# Patient Record
Sex: Female | Born: 1940 | Race: White | Hispanic: No | Marital: Married | Smoking: Never smoker
Health system: Southern US, Community
[De-identification: ages and names within clinical notes are randomized; demographics above are authoritative.]

## PROBLEM LIST (undated history)

## (undated) DIAGNOSIS — Z86010 Personal history of colonic polyps: Secondary | ICD-10-CM

## (undated) DIAGNOSIS — H25019 Cortical age-related cataract, unspecified eye: Secondary | ICD-10-CM

## (undated) DIAGNOSIS — M8580 Other specified disorders of bone density and structure, unspecified site: Secondary | ICD-10-CM

## (undated) DIAGNOSIS — M1990 Unspecified osteoarthritis, unspecified site: Secondary | ICD-10-CM

## (undated) HISTORY — PX: EYE SURGERY: SHX253

## (undated) HISTORY — PX: CHOLECYSTECTOMY: SHX55

## (undated) HISTORY — PX: COLONOSCOPY: SHX174

## (undated) HISTORY — PX: APPENDECTOMY: SHX54

## (undated) HISTORY — PX: TONSILLECTOMY: SUR1361

---

## 2017-05-18 ENCOUNTER — Other Ambulatory Visit: Payer: Self-pay | Admitting: Family Medicine

## 2017-05-18 DIAGNOSIS — Z1231 Encounter for screening mammogram for malignant neoplasm of breast: Secondary | ICD-10-CM

## 2017-06-13 ENCOUNTER — Encounter (INDEPENDENT_AMBULATORY_CARE_PROVIDER_SITE_OTHER): Payer: Self-pay

## 2017-06-13 ENCOUNTER — Ambulatory Visit
Admission: RE | Admit: 2017-06-13 | Discharge: 2017-06-13 | Disposition: A | Discharge status: Home / Self Care | Payer: Medicare HMO | Source: Ambulatory Visit | Attending: Family Medicine | Admitting: Family Medicine

## 2017-06-13 DIAGNOSIS — Z1231 Encounter for screening mammogram for malignant neoplasm of breast: Secondary | ICD-10-CM

## 2017-06-20 ENCOUNTER — Other Ambulatory Visit: Payer: Self-pay | Admitting: *Deleted

## 2017-06-20 ENCOUNTER — Inpatient Hospital Stay
Admission: RE | Admit: 2017-06-20 | Discharge: 2017-06-20 | Disposition: A | Discharge status: Home / Self Care | Payer: Self-pay | Source: Ambulatory Visit | Attending: *Deleted | Admitting: *Deleted

## 2017-06-20 DIAGNOSIS — Z9289 Personal history of other medical treatment: Secondary | ICD-10-CM

## 2019-05-26 ENCOUNTER — Other Ambulatory Visit: Payer: Self-pay | Admitting: Family Medicine

## 2019-05-26 DIAGNOSIS — Z1231 Encounter for screening mammogram for malignant neoplasm of breast: Secondary | ICD-10-CM

## 2019-06-17 ENCOUNTER — Other Ambulatory Visit: Payer: Self-pay

## 2019-06-17 ENCOUNTER — Encounter (INDEPENDENT_AMBULATORY_CARE_PROVIDER_SITE_OTHER): Payer: Self-pay

## 2019-06-17 ENCOUNTER — Ambulatory Visit
Admission: RE | Admit: 2019-06-17 | Discharge: 2019-06-17 | Disposition: A | Discharge status: Home / Self Care | Payer: Medicare HMO | Source: Ambulatory Visit | Attending: Family Medicine | Admitting: Family Medicine

## 2019-06-17 DIAGNOSIS — Z1231 Encounter for screening mammogram for malignant neoplasm of breast: Secondary | ICD-10-CM | POA: Insufficient documentation

## 2019-06-19 ENCOUNTER — Other Ambulatory Visit: Payer: Self-pay

## 2019-06-19 ENCOUNTER — Other Ambulatory Visit
Admission: RE | Admit: 2019-06-19 | Discharge: 2019-06-19 | Disposition: A | Discharge status: Home / Self Care | Payer: Medicare HMO | Source: Ambulatory Visit | Attending: Gastroenterology | Admitting: Gastroenterology

## 2019-06-19 DIAGNOSIS — Z20828 Contact with and (suspected) exposure to other viral communicable diseases: Secondary | ICD-10-CM | POA: Insufficient documentation

## 2019-06-19 DIAGNOSIS — Z01812 Encounter for preprocedural laboratory examination: Secondary | ICD-10-CM | POA: Diagnosis present

## 2019-06-19 LAB — SARS CORONAVIRUS 2 (TAT 6-24 HRS): SARS Coronavirus 2: NEGATIVE

## 2019-06-20 ENCOUNTER — Encounter: Payer: Self-pay | Admitting: *Deleted

## 2019-06-22 ENCOUNTER — Encounter: Payer: Self-pay | Admitting: Anesthesiology

## 2019-06-23 ENCOUNTER — Ambulatory Visit
Admission: RE | Admit: 2019-06-23 | Discharge: 2019-06-23 | Disposition: A | Discharge status: Home / Self Care | Payer: Medicare HMO | Attending: Gastroenterology | Admitting: Gastroenterology

## 2019-06-23 ENCOUNTER — Ambulatory Visit: Payer: Medicare HMO | Admitting: Anesthesiology

## 2019-06-23 ENCOUNTER — Encounter: Payer: Self-pay | Admitting: *Deleted

## 2019-06-23 ENCOUNTER — Encounter
Admission: RE | Disposition: A | Discharge status: Home / Self Care | Payer: Self-pay | Source: Home / Self Care | Attending: Gastroenterology

## 2019-06-23 ENCOUNTER — Other Ambulatory Visit: Payer: Self-pay

## 2019-06-23 DIAGNOSIS — M1990 Unspecified osteoarthritis, unspecified site: Secondary | ICD-10-CM | POA: Insufficient documentation

## 2019-06-23 DIAGNOSIS — D125 Benign neoplasm of sigmoid colon: Secondary | ICD-10-CM | POA: Diagnosis not present

## 2019-06-23 DIAGNOSIS — K640 First degree hemorrhoids: Secondary | ICD-10-CM | POA: Diagnosis not present

## 2019-06-23 DIAGNOSIS — Z86010 Personal history of colonic polyps: Secondary | ICD-10-CM | POA: Diagnosis not present

## 2019-06-23 DIAGNOSIS — Z880 Allergy status to penicillin: Secondary | ICD-10-CM | POA: Insufficient documentation

## 2019-06-23 DIAGNOSIS — Z800 Family history of malignant neoplasm of digestive organs: Secondary | ICD-10-CM | POA: Insufficient documentation

## 2019-06-23 DIAGNOSIS — K5730 Diverticulosis of large intestine without perforation or abscess without bleeding: Secondary | ICD-10-CM | POA: Insufficient documentation

## 2019-06-23 HISTORY — DX: Unspecified osteoarthritis, unspecified site: M19.90

## 2019-06-23 HISTORY — DX: Personal history of colonic polyps: Z86.010

## 2019-06-23 HISTORY — DX: Cortical age-related cataract, unspecified eye: H25.019

## 2019-06-23 HISTORY — DX: Other specified disorders of bone density and structure, unspecified site: M85.80

## 2019-06-23 HISTORY — PX: COLONOSCOPY WITH PROPOFOL: SHX5780

## 2019-06-23 SURGERY — COLONOSCOPY WITH PROPOFOL
Anesthesia: General

## 2019-06-23 MED ORDER — GLYCOPYRROLATE 0.2 MG/ML IJ SOLN
INTRAMUSCULAR | Status: AC
  Filled 2019-06-23: qty 1

## 2019-06-23 MED ORDER — PROPOFOL 10 MG/ML IV BOLUS
INTRAVENOUS | Status: DC | PRN
  Administered 2019-06-23: 70 mg via INTRAVENOUS

## 2019-06-23 MED ORDER — PROPOFOL 500 MG/50ML IV EMUL
INTRAVENOUS | Status: AC
  Filled 2019-06-23: qty 50

## 2019-06-23 MED ORDER — LIDOCAINE HCL (PF) 2 % IJ SOLN
INTRAMUSCULAR | Status: AC
  Filled 2019-06-23: qty 10

## 2019-06-23 MED ORDER — EPHEDRINE SULFATE 50 MG/ML IJ SOLN
INTRAMUSCULAR | Status: AC
  Filled 2019-06-23: qty 1

## 2019-06-23 MED ORDER — PROPOFOL 500 MG/50ML IV EMUL
INTRAVENOUS | Status: DC | PRN
  Administered 2019-06-23: 140 ug/kg/min via INTRAVENOUS

## 2019-06-23 MED ORDER — SODIUM CHLORIDE 0.9 % IV SOLN
INTRAVENOUS | Status: DC
  Administered 2019-06-23: 07:00:00 via INTRAVENOUS

## 2019-06-23 MED ORDER — EPHEDRINE SULFATE 50 MG/ML IJ SOLN
INTRAMUSCULAR | Status: DC | PRN
  Administered 2019-06-23: 5 mg via INTRAVENOUS

## 2019-06-23 NOTE — Op Note (Signed)
[facility] Gastroenterology Patient Name: Courtney Flores Procedure Date: 06/23/2019 7:42 AM MRN: [id number] Account #: [id number] Date of Birth: May 21, 1941 Admit Type: Outpatient Age: 78 Room: [facility] ENDO ROOM 1 Gender: Female Note Status: Finalized Procedure:            Colonoscopy Indications:          Family history of colon cancer in a first-degree                        relative before age 38 years, Personal history of                        colonic polyps Providers:            Lollie Sails, MD Referring MD:         Sofie Hartigan (Referring MD) Medicines:            Monitored Anesthesia Care Complications:        No immediate complications. Procedure:            Pre-Anesthesia Assessment:                       - ASA Grade Assessment: II - A patient with mild                        systemic disease.                       After obtaining informed consent, the colonoscope was                        passed under direct vision. Throughout the procedure,                        the patient's blood pressure, pulse, and oxygen                        saturations were monitored continuously. The                        Colonoscope was introduced through the anus and                        advanced to the the cecum, identified by appendiceal                        orifice and ileocecal valve. The colonoscopy was                        performed without difficulty. The patient tolerated the                        procedure well. The quality of the bowel preparation                        was good. Findings:      A 4 mm polyp was found in the distal sigmoid colon. The polyp was       sessile. The polyp was removed with a cold snare. Resection and       retrieval were complete.      Non-bleeding internal  hemorrhoids were found during retroflexion and       during anoscopy. The hemorrhoids were small and Grade I (internal       hemorrhoids that do not prolapse).    A few small-mouthed diverticula were found in the sigmoid colon and       descending colon.      The exam was otherwise without abnormality. Note small internal anal       pillar.      The digital rectal exam was normal. Impression:           - One 4 mm polyp in the distal sigmoid colon, removed                        with a cold snare. Resected and retrieved.                       - Non-bleeding internal hemorrhoids.                       - Diverticulosis in the sigmoid colon and in the                        descending colon.                       - The examination was otherwise normal. Recommendation:       - Discharge patient to home. Procedure Code(s):    --- Professional ---                       [phone], Colonoscopy, flexible; with removal of tumor(s),                        polyp(s), or other lesion(s) by snare technique Diagnosis Code(s):    --- Professional ---                       K63.5, Polyp of colon                       K64.0, First degree hemorrhoids                       Z80.0, Family history of malignant neoplasm of                        digestive organs                       Z86.010, Personal history of colonic polyps                       K57.30, Diverticulosis of large intestine without                        perforation or abscess without bleeding CPT copyright 2019 American [employer]. All rights reserved. The codes documented in this report are preliminary and upon coder review may  be revised to meet current compliance requirements. Lollie Sails, MD 06/23/2019 8:34:52 AM This report has been signed electronically. Number of Addenda: 0 Note Initiated On: 06/23/2019 7:42 AM Scope Withdrawal Time: 0 hours 8 minutes 51 seconds  Total Procedure Duration: 0 hours 23 minutes 48 seconds  [facility]

## 2019-06-23 NOTE — Anesthesia Postprocedure Evaluation (Signed)
Anesthesia Post Note  Patient: Courtney Flores  Procedure(s) Performed: COLONOSCOPY WITH PROPOFOL (N/A )  Patient location during evaluation: Endoscopy Anesthesia Type: General Level of consciousness: awake and alert Pain management: pain level controlled Vital Signs Assessment: post-procedure vital signs reviewed and stable Respiratory status: spontaneous breathing, nonlabored ventilation, respiratory function stable and patient connected to nasal cannula oxygen Cardiovascular status: blood pressure returned to baseline and stable Postop Assessment: no apparent nausea or vomiting Anesthetic complications: no     Last Vitals:  Vitals:   06/23/19 0854 06/23/19 0904  BP: (!) 116/94 123/72  Pulse:    Resp:    Temp:    SpO2:      Last Pain:  Vitals:   06/23/19 0904  TempSrc:   PainSc: 0-No pain                 Raushanah Osmundson S

## 2019-06-23 NOTE — Transfer of Care (Signed)
Immediate Anesthesia Transfer of Care Note  Patient: Courtney Flores  Procedure(s) Performed: COLONOSCOPY WITH PROPOFOL (N/A )  Patient Location: PACU  Anesthesia Type:General  Level of Consciousness: sedated  Airway & Oxygen Therapy: patient spontaneous breathing  Post-op Assessment: Report given to RN and Post -op Vital signs reviewed and stable  Post vital signs: Reviewed and stable  Last Vitals:  Vitals Value Taken Time  BP 96/58 06/23/19 0835  Temp 36.4 C 06/23/19 0835  Pulse 63 06/23/19 0835  Resp 12 06/23/19 0835  SpO2 99 % 06/23/19 0835  Vitals shown include unvalidated device data.  Last Pain:  Vitals:   06/23/19 0835  TempSrc:   PainSc: 0-No pain         Complications: No apparent anesthesia complications

## 2019-06-23 NOTE — H&P (Signed)
Outpatient short stay form Pre-procedure 06/23/2019 7:59 AM Lollie Sails MD  Primary Physician: Thereasa Distance, MD  Reason for visit: Colonoscopy  History of present illness: Patient is a 78 year old female presenting today for colonoscopy in regards to a personal history of colon polyps and family history of colon cancer in in a primary relative, sister.  Patient has had several abdominal instrumentations including a cholecystectomy appendectomy and C-section remote.  Tolerated her prep well.  She takes no aspirin or blood thinning agent.  She denies any problems with diarrhea abdominal pain or rectal bleeding.  She does state that in the past several weeks she has had some rectal irritation with a bowel movement.    Current Facility-Administered Medications:  .  0.9 %  sodium chloride infusion, , Intravenous, Continuous, Lollie Sails, MD, Last Rate: 20 mL/hr at 06/23/19 0710  Medications Prior to Admission  Medication Sig Dispense Refill Last Dose  . calcium-vitamin D (OSCAL WITH D) 500-200 MG-UNIT tablet Take 1 tablet by mouth daily with breakfast.   Past Week at Unknown time  . latanoprost (XALATAN) 0.005 % ophthalmic solution Place 1 drop into both eyes at bedtime.   Past Week at Unknown time  . Multiple Vitamin (MULTIVITAMIN) tablet Take 1 tablet by mouth daily.   Past Week at Unknown time     Allergies  Allergen Reactions  . Penicillins Other (See Comments)    UNKNOWN     Past Medical History:  Diagnosis Date  . Arthritis   . Cataract cortical, senile   . History of colon polyps   . Osteopenia     Review of systems:      Physical Exam    Heart and lungs: Regular rate and rhythm without rub or gallop lungs are bilaterally clear    HEENT: Normocephalic atraumatic eyes are anicteric    Other:    Pertinant exam for procedure: Soft nontender nondistended bowel sounds positive normal active    Planned proceedures: Colonoscopy and indicated procedures.  I have discussed the risks benefits and complications of procedures to include not limited to bleeding, infection, perforation and the risk of sedation and the patient wishes to proceed.    Lollie Sails, MD Gastroenterology 06/23/2019  7:59 AM

## 2019-06-23 NOTE — Anesthesia Preprocedure Evaluation (Signed)
Anesthesia Evaluation  Patient identified by MRN, date of birth, ID band Patient awake    Reviewed: Allergy & Precautions, NPO status , Patient's Chart, lab work & pertinent test results, reviewed documented beta blocker date and time   Airway Mallampati: II  TM Distance: >3 FB     Dental  (+) Chipped   Pulmonary           Cardiovascular      Neuro/Psych    GI/Hepatic   Endo/Other    Renal/GU      Musculoskeletal  (+) Arthritis ,   Abdominal   Peds  Hematology   Anesthesia Other Findings   Reproductive/Obstetrics                             Anesthesia Physical Anesthesia Plan  ASA: II  Anesthesia Plan: General   Post-op Pain Management:    Induction: Intravenous  PONV Risk Score and Plan:   Airway Management Planned:   Additional Equipment:   Intra-op Plan:   Post-operative Plan:   Informed Consent: I have reviewed the patients History and Physical, chart, labs and discussed the procedure including the risks, benefits and alternatives for the proposed anesthesia with the patient or authorized representative who has indicated his/her understanding and acceptance.       Plan Discussed with: CRNA  Anesthesia Plan Comments:         Anesthesia Quick Evaluation

## 2019-06-23 NOTE — Anesthesia Post-op Follow-up Note (Signed)
Anesthesia QCDR form completed.        

## 2019-06-24 ENCOUNTER — Encounter: Payer: Self-pay | Admitting: Gastroenterology

## 2019-06-24 LAB — SURGICAL PATHOLOGY

## 2019-12-19 ENCOUNTER — Other Ambulatory Visit: Payer: Self-pay | Admitting: Orthopedic Surgery

## 2019-12-22 ENCOUNTER — Ambulatory Visit: Payer: Medicare HMO | Admitting: Anesthesiology

## 2019-12-22 ENCOUNTER — Encounter
Admission: RE | Disposition: A | Discharge status: Home / Self Care | Payer: Self-pay | Source: Home / Self Care | Attending: Orthopedic Surgery

## 2019-12-22 ENCOUNTER — Encounter: Payer: Self-pay | Admitting: Orthopedic Surgery

## 2019-12-22 ENCOUNTER — Ambulatory Visit: Payer: Medicare HMO

## 2019-12-22 ENCOUNTER — Other Ambulatory Visit
Admission: RE | Admit: 2019-12-22 | Discharge: 2019-12-22 | Disposition: A | Discharge status: Home / Self Care | Payer: Medicare HMO | Source: Ambulatory Visit | Attending: Orthopedic Surgery | Admitting: Orthopedic Surgery

## 2019-12-22 ENCOUNTER — Other Ambulatory Visit: Payer: Self-pay

## 2019-12-22 ENCOUNTER — Ambulatory Visit
Admission: RE | Admit: 2019-12-22 | Discharge: 2019-12-22 | Disposition: A | Discharge status: Home / Self Care | Payer: Medicare HMO | Attending: Orthopedic Surgery | Admitting: Orthopedic Surgery

## 2019-12-22 DIAGNOSIS — Z419 Encounter for procedure for purposes other than remedying health state, unspecified: Secondary | ICD-10-CM

## 2019-12-22 DIAGNOSIS — Z880 Allergy status to penicillin: Secondary | ICD-10-CM | POA: Insufficient documentation

## 2019-12-22 DIAGNOSIS — M1990 Unspecified osteoarthritis, unspecified site: Secondary | ICD-10-CM | POA: Insufficient documentation

## 2019-12-22 DIAGNOSIS — Z800 Family history of malignant neoplasm of digestive organs: Secondary | ICD-10-CM | POA: Insufficient documentation

## 2019-12-22 DIAGNOSIS — Z79899 Other long term (current) drug therapy: Secondary | ICD-10-CM | POA: Insufficient documentation

## 2019-12-22 DIAGNOSIS — Z825 Family history of asthma and other chronic lower respiratory diseases: Secondary | ICD-10-CM | POA: Insufficient documentation

## 2019-12-22 DIAGNOSIS — M8580 Other specified disorders of bone density and structure, unspecified site: Secondary | ICD-10-CM | POA: Insufficient documentation

## 2019-12-22 DIAGNOSIS — Z8249 Family history of ischemic heart disease and other diseases of the circulatory system: Secondary | ICD-10-CM | POA: Diagnosis not present

## 2019-12-22 DIAGNOSIS — Z811 Family history of alcohol abuse and dependence: Secondary | ICD-10-CM | POA: Insufficient documentation

## 2019-12-22 DIAGNOSIS — Z20822 Contact with and (suspected) exposure to covid-19: Secondary | ICD-10-CM | POA: Diagnosis not present

## 2019-12-22 DIAGNOSIS — W19XXXA Unspecified fall, initial encounter: Secondary | ICD-10-CM | POA: Insufficient documentation

## 2019-12-22 DIAGNOSIS — Z86010 Personal history of colonic polyps: Secondary | ICD-10-CM | POA: Diagnosis not present

## 2019-12-22 DIAGNOSIS — S72011A Unspecified intracapsular fracture of right femur, initial encounter for closed fracture: Secondary | ICD-10-CM | POA: Diagnosis present

## 2019-12-22 DIAGNOSIS — Z9049 Acquired absence of other specified parts of digestive tract: Secondary | ICD-10-CM | POA: Diagnosis not present

## 2019-12-22 DIAGNOSIS — Z833 Family history of diabetes mellitus: Secondary | ICD-10-CM | POA: Insufficient documentation

## 2019-12-22 DIAGNOSIS — Z8042 Family history of malignant neoplasm of prostate: Secondary | ICD-10-CM | POA: Diagnosis not present

## 2019-12-22 HISTORY — PX: HIP PINNING,CANNULATED: SHX1758

## 2019-12-22 LAB — RESPIRATORY PANEL BY RT PCR (FLU A&B, COVID)
Influenza A by PCR: NEGATIVE
Influenza B by PCR: NEGATIVE
SARS Coronavirus 2 by RT PCR: NEGATIVE

## 2019-12-22 SURGERY — FIXATION, FEMUR, NECK, PERCUTANEOUS, USING SCREW
Anesthesia: General | Site: Hip | Laterality: Right

## 2019-12-22 MED ORDER — SODIUM CHLORIDE (PF) 0.9 % IJ SOLN
INTRAMUSCULAR | Status: AC
  Filled 2019-12-22: qty 50

## 2019-12-22 MED ORDER — DEXAMETHASONE SODIUM PHOSPHATE 10 MG/ML IJ SOLN
INTRAMUSCULAR | Status: AC
  Filled 2019-12-22: qty 1

## 2019-12-22 MED ORDER — BUPIVACAINE HCL (PF) 0.5 % IJ SOLN
INTRAMUSCULAR | Status: AC
  Filled 2019-12-22: qty 30

## 2019-12-22 MED ORDER — FAMOTIDINE 20 MG PO TABS
20.0000 mg | ORAL_TABLET | Freq: Once | ORAL | Status: AC
  Administered 2019-12-22: 13:00:00 20 mg via ORAL

## 2019-12-22 MED ORDER — CLINDAMYCIN PHOSPHATE 900 MG/50ML IV SOLN
INTRAVENOUS | Status: AC
  Filled 2019-12-22: qty 50

## 2019-12-22 MED ORDER — FENTANYL CITRATE (PF) 100 MCG/2ML IJ SOLN
INTRAMUSCULAR | Status: AC
  Filled 2019-12-22: qty 2

## 2019-12-22 MED ORDER — ONDANSETRON HCL 4 MG/2ML IJ SOLN
INTRAMUSCULAR | Status: AC
  Filled 2019-12-22: qty 2

## 2019-12-22 MED ORDER — ONDANSETRON HCL 4 MG/2ML IJ SOLN: 4.0000 mg | Freq: Once | INTRAMUSCULAR | Status: DC | PRN

## 2019-12-22 MED ORDER — CLINDAMYCIN PHOSPHATE 900 MG/50ML IV SOLN
900.0000 mg | INTRAVENOUS | Status: AC
  Administered 2019-12-22: 16:00:00 900 mg via INTRAVENOUS

## 2019-12-22 MED ORDER — FENTANYL CITRATE (PF) 100 MCG/2ML IJ SOLN
INTRAMUSCULAR | Status: AC
  Administered 2019-12-22: 25 ug via INTRAVENOUS
  Filled 2019-12-22: qty 2

## 2019-12-22 MED ORDER — FENTANYL CITRATE (PF) 100 MCG/2ML IJ SOLN
INTRAMUSCULAR | Status: DC | PRN
  Administered 2019-12-22: 25 ug via INTRAVENOUS

## 2019-12-22 MED ORDER — MIDAZOLAM HCL 2 MG/2ML IJ SOLN
INTRAMUSCULAR | Status: DC | PRN
  Administered 2019-12-22 (×2): 1 mg via INTRAVENOUS

## 2019-12-22 MED ORDER — GLYCOPYRROLATE 0.2 MG/ML IJ SOLN
INTRAMUSCULAR | Status: AC
  Filled 2019-12-22: qty 1

## 2019-12-22 MED ORDER — FENTANYL CITRATE (PF) 100 MCG/2ML IJ SOLN
25.0000 ug | INTRAMUSCULAR | Status: DC | PRN
  Administered 2019-12-22: 25 ug via INTRAVENOUS

## 2019-12-22 MED ORDER — ACETAMINOPHEN 10 MG/ML IV SOLN
INTRAVENOUS | Status: AC
  Filled 2019-12-22: qty 100

## 2019-12-22 MED ORDER — OXYCODONE HCL 5 MG PO TABS: 2.5000 mg | ORAL_TABLET | ORAL | 0 refills | Status: AC | PRN

## 2019-12-22 MED ORDER — NEOMYCIN-POLYMYXIN B GU 40-200000 IR SOLN
Status: AC
  Filled 2019-12-22: qty 2

## 2019-12-22 MED ORDER — PHENYLEPHRINE HCL (PRESSORS) 10 MG/ML IV SOLN
INTRAVENOUS | Status: AC
  Filled 2019-12-22: qty 1

## 2019-12-22 MED ORDER — SODIUM CHLORIDE 0.9 % IR SOLN
Status: DC | PRN
  Administered 2019-12-22: 16:00:00 500 mL

## 2019-12-22 MED ORDER — ACETAMINOPHEN 500 MG PO TABS: 1000.0000 mg | ORAL_TABLET | Freq: Three times a day (TID) | ORAL | 2 refills | Status: AC

## 2019-12-22 MED ORDER — LIDOCAINE HCL (PF) 1 % IJ SOLN
INTRAMUSCULAR | Status: AC
  Filled 2019-12-22: qty 30

## 2019-12-22 MED ORDER — ONDANSETRON HCL 4 MG/2ML IJ SOLN
INTRAMUSCULAR | Status: DC | PRN
  Administered 2019-12-22: 4 mg via INTRAVENOUS

## 2019-12-22 MED ORDER — OXYCODONE HCL 5 MG/5ML PO SOLN: 5.0000 mg | Freq: Once | ORAL | Status: AC | PRN

## 2019-12-22 MED ORDER — MIDAZOLAM HCL 2 MG/2ML IJ SOLN
INTRAMUSCULAR | Status: AC
  Filled 2019-12-22: qty 2

## 2019-12-22 MED ORDER — BUPIVACAINE LIPOSOME 1.3 % IJ SUSP
INTRAMUSCULAR | Status: AC
  Filled 2019-12-22: qty 20

## 2019-12-22 MED ORDER — PROPOFOL 10 MG/ML IV BOLUS
INTRAVENOUS | Status: DC | PRN
  Administered 2019-12-22: 110 mg via INTRAVENOUS

## 2019-12-22 MED ORDER — VANCOMYCIN HCL 1000 MG IV SOLR
INTRAVENOUS | Status: AC
  Filled 2019-12-22: qty 1000

## 2019-12-22 MED ORDER — LACTATED RINGERS IV SOLN: INTRAVENOUS | Status: DC

## 2019-12-22 MED ORDER — ASPIRIN EC 325 MG PO TBEC: 325.0000 mg | DELAYED_RELEASE_TABLET | Freq: Every day | ORAL | 0 refills | Status: AC

## 2019-12-22 MED ORDER — GLYCOPYRROLATE 0.2 MG/ML IJ SOLN
INTRAMUSCULAR | Status: DC | PRN
  Administered 2019-12-22: .2 mg via INTRAVENOUS

## 2019-12-22 MED ORDER — SODIUM CHLORIDE 0.9 % IV SOLN
INTRAVENOUS | Status: DC | PRN
  Administered 2019-12-22: 16:00:00 20 ug/min via INTRAVENOUS

## 2019-12-22 MED ORDER — OXYCODONE HCL 5 MG PO TABS: 5.0000 mg | ORAL_TABLET | Freq: Once | ORAL | Status: AC | PRN

## 2019-12-22 MED ORDER — LIDOCAINE HCL (CARDIAC) PF 100 MG/5ML IV SOSY
PREFILLED_SYRINGE | INTRAVENOUS | Status: DC | PRN
  Administered 2019-12-22: 80 mg via INTRAVENOUS

## 2019-12-22 MED ORDER — PHENYLEPHRINE HCL (PRESSORS) 10 MG/ML IV SOLN
INTRAVENOUS | Status: DC | PRN
  Administered 2019-12-22 (×4): 100 ug via INTRAVENOUS

## 2019-12-22 MED ORDER — EPHEDRINE 5 MG/ML INJ
INTRAVENOUS | Status: AC
  Filled 2019-12-22: qty 10

## 2019-12-22 MED ORDER — OXYCODONE HCL 5 MG PO TABS
ORAL_TABLET | ORAL | Status: AC
  Administered 2019-12-22: 5 mg via ORAL
  Filled 2019-12-22: qty 1

## 2019-12-22 MED ORDER — ACETAMINOPHEN 10 MG/ML IV SOLN
INTRAVENOUS | Status: DC | PRN
  Administered 2019-12-22: 1000 mg via INTRAVENOUS

## 2019-12-22 MED ORDER — BUPIVACAINE LIPOSOME 1.3 % IJ SUSP
INTRAMUSCULAR | Status: DC | PRN
  Administered 2019-12-22: 50 mL

## 2019-12-22 MED ORDER — ONDANSETRON 4 MG PO TBDP: 4.0000 mg | ORAL_TABLET | Freq: Three times a day (TID) | ORAL | 0 refills | Status: AC | PRN

## 2019-12-22 MED ORDER — FAMOTIDINE 20 MG PO TABS
ORAL_TABLET | ORAL | Status: AC
  Filled 2019-12-22: qty 1

## 2019-12-22 MED ORDER — DEXAMETHASONE SODIUM PHOSPHATE 10 MG/ML IJ SOLN
INTRAMUSCULAR | Status: DC | PRN
  Administered 2019-12-22: 10 mg via INTRAVENOUS

## 2019-12-22 SURGICAL SUPPLY — 49 items
"PENCIL ELECTRO HAND CTR " (MISCELLANEOUS) ×1 IMPLANT
BIT DRILL 4.9 CANNULATED (BIT) ×1
BIT DRILL CANN QC 4.9 LRG (BIT) IMPLANT
BLADE SURG 15 STRL LF DISP TIS (BLADE) ×1 IMPLANT
BLADE SURG 15 STRL SS (BLADE) ×2
CANISTER SUCT 1200ML W/VALVE (MISCELLANEOUS) ×3 IMPLANT
CHLORAPREP W/TINT 26 (MISCELLANEOUS) ×3 IMPLANT
COVER WAND RF STERILE (DRAPES) ×3 IMPLANT
DERMABOND ADVANCED (GAUZE/BANDAGES/DRESSINGS) ×2
DERMABOND ADVANCED .7 DNX12 (GAUZE/BANDAGES/DRESSINGS) IMPLANT
DRAPE 3/4 80X56 (DRAPES) ×3 IMPLANT
DRAPE SURG 17X11 SM STRL (DRAPES) ×6 IMPLANT
DRAPE U-SHAPE 47X51 STRL (DRAPES) ×6 IMPLANT
DRILL BIT CANNULATED 4.9 (BIT) ×2
DRSG OPSITE POSTOP 3X4 (GAUZE/BANDAGES/DRESSINGS) ×3 IMPLANT
ELECT REM PT RETURN 9FT ADLT (ELECTROSURGICAL) ×3
ELECTRODE REM PT RTRN 9FT ADLT (ELECTROSURGICAL) ×1 IMPLANT
GAUZE XEROFORM 1X8 LF (GAUZE/BANDAGES/DRESSINGS) ×1 IMPLANT
GLOVE BIOGEL PI IND STRL 8 (GLOVE) ×1 IMPLANT
GLOVE BIOGEL PI INDICATOR 8 (GLOVE) ×2
GLOVE SURG SYN 7.5  E (GLOVE) ×4
GLOVE SURG SYN 7.5 E (GLOVE) ×2 IMPLANT
GLOVE SURG SYN 7.5 PF PI (GLOVE) ×2 IMPLANT
GOWN STRL REUS W/ TWL LRG LVL3 (GOWN DISPOSABLE) ×1 IMPLANT
GOWN STRL REUS W/ TWL XL LVL3 (GOWN DISPOSABLE) ×1 IMPLANT
GOWN STRL REUS W/TWL LRG LVL3 (GOWN DISPOSABLE) ×2
GOWN STRL REUS W/TWL XL LVL3 (GOWN DISPOSABLE) ×2
GUIDEWIRE ASNIS 3.2 NONCAL (WIRE) ×8 IMPLANT
KIT TURNOVER CYSTO (KITS) ×3 IMPLANT
MAT ABSORB  FLUID 56X50 GRAY (MISCELLANEOUS) ×4
MAT ABSORB FLUID 56X50 GRAY (MISCELLANEOUS) ×2 IMPLANT
NDL FILTER BLUNT 18X1 1/2 (NEEDLE) ×1 IMPLANT
NEEDLE FILTER BLUNT 18X 1/2SAF (NEEDLE) ×2
NEEDLE FILTER BLUNT 18X1 1/2 (NEEDLE) ×1 IMPLANT
NEEDLE HYPO 22GX1.5 SAFETY (NEEDLE) ×3 IMPLANT
NS IRRIG 500ML POUR BTL (IV SOLUTION) ×3 IMPLANT
PACK HIP COMPR (MISCELLANEOUS) ×3 IMPLANT
PENCIL ELECTRO HAND CTR (MISCELLANEOUS) ×3 IMPLANT
SCREW ASNIS 75MM (Screw) ×4 IMPLANT
SCREW CANN 6.5X80 NONSTRL (Screw) ×4 IMPLANT
STAPLER SKIN PROX 35W (STAPLE) ×1 IMPLANT
SUT MNCRL AB 4-0 PS2 18 (SUTURE) ×2 IMPLANT
SUT VIC AB 0 CT1 36 (SUTURE) ×3 IMPLANT
SUT VIC AB 2-0 CT1 27 (SUTURE) ×2
SUT VIC AB 2-0 CT1 TAPERPNT 27 (SUTURE) ×1 IMPLANT
SYR 30ML LL (SYRINGE) ×3 IMPLANT
SYR 5ML LL (SYRINGE) ×3 IMPLANT
TAPE CLOTH 3X10 WHT NS LF (GAUZE/BANDAGES/DRESSINGS) ×3 IMPLANT
WASHER SCREW MATTA SS 13.0X1.5 (Washer) ×6 IMPLANT

## 2019-12-22 NOTE — Anesthesia Postprocedure Evaluation (Signed)
Anesthesia Post Note  Patient: Courtney Flores  Procedure(s) Performed: CANNULATED HIP PINNING (Right Hip)  Patient location during evaluation: PACU Anesthesia Type: General Level of consciousness: awake and alert and oriented Pain management: pain level controlled Vital Signs Assessment: post-procedure vital signs reviewed and stable Respiratory status: spontaneous breathing Cardiovascular status: blood pressure returned to baseline Anesthetic complications: no     Last Vitals:  Vitals:   12/22/19 1732 12/22/19 1749  BP: 124/65 (!) 116/59  Pulse: 74 72  Resp: 14 14  Temp:    SpO2: 100% 98%    Last Pain:  Vitals:   12/22/19 1749  TempSrc:   PainSc: Asleep                 Julia Alkhatib

## 2019-12-22 NOTE — Discharge Instructions (Signed)
Discharge Instructions - Hip Fracture Surgery   Post-Op Instructions   1. Bracing/crutches/walker - Use crutches or walker as needed until able to wean off with physical therapist.   2. Ice: You should ice the area of surgery ~20-30 minutes on with a similar time without ice. Do this for the first week at least to help with pain and swelling.    3. Driving:  Plan on not driving for at least two weeks. Please note that you are advised NOT to drive while taking narcotic pain medications as you may be impaired and unsafe to drive.   4. Activity: Ankle pumps several times an hour while awake to prevent blood clots. Weight bearing: as tolerated. Use crutches or walker as needed until pain allows you to ambulate without a limp. Bending and straightening the knee and hip is unlimited. Avoid standing more than 5-10 minutes (consecutively) for the first week.  Avoid long distance travel for 2 weeks.  5. Medications:  - You have been provided a prescription for narcotic pain medicine. After surgery, take 1-2 narcotic tablets every 4 hours if needed for severe pain.  - You may take up to 3000mg /day of tylenol (acetaminophen). You can take 1000mg  3x/day. Please check your narcotic. If you have acetaminophen in your narcotic (each tablet will be 325mg ), be careful not to exceed a total of 3000mg /day of acetaminophen.  - A prescription for anti-nausea medication will be provided in case the narcotic medicine or anesthesia causes nausea - take 1 tablet every 6 hours only if nauseated.  - Take enteric coated aspirin 325 mg once daily for 4 weeks to prevent blood clots.    6. Bandages: May remove after 5 days. If there is no drainage, you may shower. There is a clear/purplish adhesive/glue type material overlying the incision. This will fall off on its own.   7. Physical Therapy: Home health physical therapy to start over the next week. The therapist will provide home exercises. No need for exercises until 1st  physical therapy session.     8. Post-Op Appointments: Your first post-op appointment will be with Dr. Posey Pronto in approximately 2 weeks time.     If you find that they have not been scheduled please call the Orthopaedic Appointment front desk at [phone].

## 2019-12-22 NOTE — Anesthesia Preprocedure Evaluation (Signed)
Anesthesia Evaluation  Patient identified by MRN, date of birth, ID band Patient awake    Reviewed: Allergy & Precautions, NPO status , Patient's Chart, lab work & pertinent test results  History of Anesthesia Complications Negative for: history of anesthetic complications  Airway Mallampati: II  TM Distance: >3 FB Neck ROM: Full    Dental  (+) Implants   Pulmonary neg pulmonary ROS, neg sleep apnea, neg COPD,    breath sounds clear to auscultation- rhonchi (-) wheezing      Cardiovascular (-) hypertension(-) CAD, (-) Past MI, (-) Cardiac Stents and (-) CABG  Rhythm:Regular Rate:Normal - Systolic murmurs and - Diastolic murmurs    Neuro/Psych neg Seizures negative neurological ROS  negative psych ROS   GI/Hepatic negative GI ROS, Neg liver ROS,   Endo/Other  negative endocrine ROSneg diabetes  Renal/GU negative Renal ROS     Musculoskeletal  (+) Arthritis ,   Abdominal (+) - obese,   Peds  Hematology negative hematology ROS (+)   Anesthesia Other Findings Past Medical History: No date: Arthritis 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, senile No date: History of colon polyps No date: Osteopenia   Reproductive/Obstetrics                             Anesthesia Physical Anesthesia Plan  ASA: II  Anesthesia Plan: General   Post-op Pain Management:    Induction: Intravenous  PONV Risk Score and Plan: 2 and Ondansetron, Dexamethasone and Treatment may vary due to age or medical condition  Airway Management Planned: LMA  Additional Equipment:   Intra-op Plan:   Post-operative Plan:   Informed Consent: I have reviewed the patients History and Physical, chart, labs and discussed the procedure including the risks, benefits and alternatives for the proposed anesthesia with the patient or authorized representative who has indicated his/her understanding and acceptance.     Dental advisory  given  Plan Discussed with: CRNA and Anesthesiologist  Anesthesia Plan Comments:         Anesthesia Quick Evaluation

## 2019-12-22 NOTE — Op Note (Signed)
DATE OF SURGERY: 12/22/2019  PREOPERATIVE DIAGNOSIS: Right valgus impacted femoral neck fracture  POSTOPERATIVE DIAGNOSIS: Right valgus impacted femoral neck fracture  PROCEDURE: Percutaneous pinning of Right femoral neck fracture  SURGEON: Cato Mulligan, MD  ASSISTANTS: Benson Norway, PA-S  ANESTHESIA: Gen  EBL: 50 cc  COMPONENTS:  Stryker 6.51mm cannulated screws x 3 with washers (64mm, 93mm, 24mm)   INDICATIONS: Courtney Flores is a 79 y.o. female who sustained a valgus impacted femoral neck fracture after a fall ~1 week ago. She had continued to bear weight until she obtained radiographs at her PCP's office which showed R valgus impacted femoral neck fracture. Risks and benefits of percutaneous pinning were explained to the patient and family. Risks include but are not limited to bleeding, infection, injury to tissues, nerves, vessels, DVT/PE, malunion/nonunion, hardware failure, avascular necrosis, and risks of anesthesia. The patient and family understand these risks, have completed an informed consent, and wish to proceed.   PROCEDURE:  The patient was brought into the operating room. After administering general anesthesia, the patient was placed in the supine position on the Hana table. The uninjured leg was extended while the injured lower extremity was placed in a neutral position with care taken to not displace the fracture during positioning. The lateral aspects of the operative hip and thigh were prepped with ChloraPrep solution before being draped sterilely. IV antibiotics were administered. A timeout was performed to verify the appropriate surgical site, patient, and procedure.   The greater trochanter was identified and an approximately 6 cm incision was made over the lateral aspect of the proximal femur. The incision was carried down through the subcutaneous tissues to expose the IT band. This was split at the proximal portion of the incision and the vastus lateralis was split  in line with its fibers. The lateral aspect of the femur was cleared of soft tissue. Under fluoroscopic guidance, a guidewire was placed along the inferior aspect of the femoral neck into the head while ensuring the start point on the lateral cortex was not below the level of the lesser trochanter. A parallel guide was used to place two additional guidepins (superior posterior and superior anterior). Position of all pins was verified fluoroscopically in both the AP and lateral views. A measuring device was used the measure appropriate screw length. The guidepins were drilled with a 3.26mm drill. Appropriately sized screws were advanced starting with the inferior screw, then superior posterior, then superior anterior. Screws were sequentially tightened. Hardware position and bony alignment was confirmed fluoroscopically with AP and lateral views.   The wounds were irrigated thoroughly with sterile saline solution. The IT band was closed with 0-Vicryl. Deep fat sutures were also placed with 0-Vicryl. The subcutaneous tissues were closed using 2-0 Vicryl interrupted sutures. The skin was closed using 4-0 Monocryl and Dermabond. Sterile occlusive dressing was applied. The patient was then transferred to the recovery room in satisfactory condition after tolerating the procedure well.  POSTOPERATIVE PLAN: The patient will be WBAT on the operative extremity. ASA 325mg  daily x 4 weeks to start on POD#1. Home health PT. F/U in ~2 weeks.

## 2019-12-22 NOTE — Transfer of Care (Signed)
Immediate Anesthesia Transfer of Care Note  Patient: Courtney Flores  Procedure(s) Performed: CANNULATED HIP PINNING (Right Hip)  Patient Location: PACU  Anesthesia Type:General  Level of Consciousness: drowsy  Airway & Oxygen Therapy: Patient Spontanous Breathing and Patient connected to face mask oxygen  Post-op Assessment: Report given to RN and Post -op Vital signs reviewed and stable  Post vital signs: Reviewed and stable  Last Vitals:  Vitals Value Taken Time  BP 124/65 12/22/19 1732  Temp    Pulse 74 12/22/19 1736  Resp 19 12/22/19 1736  SpO2 100 % 12/22/19 1736  Vitals shown include unvalidated device data.  Last Pain:  Vitals:   12/22/19 1149  TempSrc: Tympanic         Complications: No apparent anesthesia complications

## 2019-12-22 NOTE — H&P (Signed)
Paper H&P to be scanned into permanent record. H&P reviewed. No significant changes noted.  

## 2019-12-22 NOTE — Anesthesia Procedure Notes (Signed)
Procedure Name: LMA Insertion Performed by: Kelton Pillar, CRNA Pre-anesthesia Checklist: Patient identified, Emergency Drugs available, Suction available and Patient being monitored Patient Re-evaluated:Patient Re-evaluated prior to induction Oxygen Delivery Method: Circle system utilized Preoxygenation: Pre-oxygenation with 100% oxygen Induction Type: IV induction Ventilation: Mask ventilation without difficulty LMA: LMA inserted LMA Size: 3.0 Number of attempts: 1 Tube secured with: Tape Dental Injury: Teeth and Oropharynx as per pre-operative assessment

## 2020-08-18 ENCOUNTER — Other Ambulatory Visit (HOSPITAL_COMMUNITY): Payer: Self-pay | Admitting: Family Medicine

## 2020-08-18 ENCOUNTER — Other Ambulatory Visit: Payer: Self-pay | Admitting: Family Medicine

## 2020-08-18 DIAGNOSIS — J9859 Other diseases of mediastinum, not elsewhere classified: Secondary | ICD-10-CM

## 2020-08-26 ENCOUNTER — Other Ambulatory Visit: Payer: Self-pay

## 2020-08-26 ENCOUNTER — Ambulatory Visit
Admission: RE | Admit: 2020-08-26 | Discharge: 2020-08-26 | Disposition: A | Discharge status: Home / Self Care | Payer: Medicare HMO | Source: Ambulatory Visit | Attending: Family Medicine | Admitting: Family Medicine

## 2020-08-26 ENCOUNTER — Other Ambulatory Visit
Admission: RE | Admit: 2020-08-26 | Discharge: 2020-08-26 | Disposition: A | Discharge status: Home / Self Care | Payer: Medicare HMO | Source: Home / Self Care | Attending: Family Medicine | Admitting: Family Medicine

## 2020-08-26 DIAGNOSIS — J9859 Other diseases of mediastinum, not elsewhere classified: Secondary | ICD-10-CM | POA: Diagnosis not present

## 2020-08-26 DIAGNOSIS — Z01812 Encounter for preprocedural laboratory examination: Secondary | ICD-10-CM | POA: Insufficient documentation

## 2020-08-26 LAB — CREATININE, SERUM
Creatinine, Ser: 0.74 mg/dL (ref 0.44–1.00)
GFR, Estimated: >60 mL/min (ref >60)

## 2020-08-26 MED ORDER — IOHEXOL 300 MG/ML  SOLN
75.0000 mL | Freq: Once | INTRAMUSCULAR | Status: AC | PRN
  Administered 2020-08-26: 75 mL via INTRAVENOUS

## 2020-12-24 ENCOUNTER — Encounter: Payer: Self-pay | Admitting: *Deleted

## 2020-12-27 ENCOUNTER — Encounter: Payer: Self-pay | Admitting: Anesthesiology

## 2020-12-27 ENCOUNTER — Ambulatory Visit
Admission: RE | Admit: 2020-12-27 | Discharge: 2020-12-27 | Disposition: A | Discharge status: Home / Self Care | Payer: Medicare HMO | Source: Ambulatory Visit | Attending: Gastroenterology | Admitting: Gastroenterology

## 2020-12-27 ENCOUNTER — Encounter
Admission: RE | Disposition: A | Discharge status: Home / Self Care | Payer: Self-pay | Source: Ambulatory Visit | Attending: Gastroenterology

## 2020-12-27 ENCOUNTER — Ambulatory Visit: Payer: Medicare HMO | Admitting: Anesthesiology

## 2020-12-27 DIAGNOSIS — Z880 Allergy status to penicillin: Secondary | ICD-10-CM | POA: Insufficient documentation

## 2020-12-27 DIAGNOSIS — K317 Polyp of stomach and duodenum: Secondary | ICD-10-CM | POA: Insufficient documentation

## 2020-12-27 DIAGNOSIS — R053 Chronic cough: Secondary | ICD-10-CM | POA: Insufficient documentation

## 2020-12-27 DIAGNOSIS — Z885 Allergy status to narcotic agent status: Secondary | ICD-10-CM | POA: Insufficient documentation

## 2020-12-27 DIAGNOSIS — Z7951 Long term (current) use of inhaled steroids: Secondary | ICD-10-CM | POA: Diagnosis not present

## 2020-12-27 DIAGNOSIS — K219 Gastro-esophageal reflux disease without esophagitis: Secondary | ICD-10-CM | POA: Insufficient documentation

## 2020-12-27 DIAGNOSIS — Z79899 Other long term (current) drug therapy: Secondary | ICD-10-CM | POA: Insufficient documentation

## 2020-12-27 DIAGNOSIS — F419 Anxiety disorder, unspecified: Secondary | ICD-10-CM | POA: Insufficient documentation

## 2020-12-27 DIAGNOSIS — R059 Cough, unspecified: Secondary | ICD-10-CM | POA: Diagnosis not present

## 2020-12-27 DIAGNOSIS — K449 Diaphragmatic hernia without obstruction or gangrene: Secondary | ICD-10-CM | POA: Diagnosis not present

## 2020-12-27 HISTORY — PX: ESOPHAGOGASTRODUODENOSCOPY (EGD) WITH PROPOFOL: SHX5813

## 2020-12-27 LAB — KOH PREP

## 2020-12-27 SURGERY — ESOPHAGOGASTRODUODENOSCOPY (EGD) WITH PROPOFOL
Anesthesia: General

## 2020-12-27 MED ORDER — PROPOFOL 500 MG/50ML IV EMUL
INTRAVENOUS | Status: DC | PRN
  Administered 2020-12-27: 120 ug/kg/min via INTRAVENOUS

## 2020-12-27 MED ORDER — PROPOFOL 500 MG/50ML IV EMUL
INTRAVENOUS | Status: AC
  Filled 2020-12-27: qty 50

## 2020-12-27 MED ORDER — LIDOCAINE HCL (PF) 2 % IJ SOLN
INTRAMUSCULAR | Status: AC
  Filled 2020-12-27: qty 5

## 2020-12-27 MED ORDER — LIDOCAINE HCL (CARDIAC) PF 100 MG/5ML IV SOSY
PREFILLED_SYRINGE | INTRAVENOUS | Status: DC | PRN
  Administered 2020-12-27: 50 mg via INTRAVENOUS

## 2020-12-27 MED ORDER — SODIUM CHLORIDE 0.9 % IV SOLN
INTRAVENOUS | Status: DC
  Administered 2020-12-27: 1000 mL via INTRAVENOUS

## 2020-12-27 NOTE — Transfer of Care (Signed)
Immediate Anesthesia Transfer of Care Note  Patient: EMAAN GARY  Procedure(s) Performed: ESOPHAGOGASTRODUODENOSCOPY (EGD) WITH PROPOFOL (N/A )  Patient Location: PACU  Anesthesia Type:General  Level of Consciousness: awake and sedated  Airway & Oxygen Therapy: Patient Spontanous Breathing and Patient connected to nasal cannula oxygen  Post-op Assessment: Report given to RN  Post vital signs: Reviewed and stable  Last Vitals:  Vitals Value Taken Time  BP    Temp    Pulse    Resp    SpO2      Last Pain:  Vitals:   12/27/20 1028  TempSrc: Temporal  PainSc: 0-No pain         Complications: No complications documented.

## 2020-12-27 NOTE — H&P (Signed)
Outpatient short stay form Pre-procedure 12/27/2020 11:19 AM Raylene Miyamoto MD, MPH  Primary Physician: Dr. Ellison Hughs  Reason for visit:  Chronic cough/Globus sensation  History of present illness:   80 y/o lady with history of anxiety and cough which over the past 1-2 years has been more persistent. Responded both to PPI and advair. Here for EGD for further evaluation. Feels globus sensation in back of throat. No blood thinners. No abdominal surgery or neck surgery.    Current Facility-Administered Medications:  .  0.9 %  sodium chloride infusion, , Intravenous, Continuous, Givanni Staron, Hilton Cork, MD, Last Rate: 20 mL/hr at 12/27/20 1043, 1,000 mL at 12/27/20 1043  Medications Prior to Admission  Medication Sig Dispense Refill Last Dose  . calcium-vitamin D (OSCAL WITH D) 500-200 MG-UNIT tablet Take 1 tablet by mouth daily with breakfast.   12/26/2020 at Unknown time  . clobetasol ointment (TEMOVATE) 1.61 % Apply 1 application topically 2 (two) times daily.   12/26/2020 at Unknown time  . clonazePAM (KLONOPIN) 0.5 MG tablet Take 0.5 mg by mouth at bedtime as needed (sleep).   Past Week at Unknown time  . FLUoxetine (PROZAC) 20 MG tablet Take 20 mg by mouth daily.   Past Week at Unknown time  . fluticasone-salmeterol (ADVAIR HFA) 230-21 MCG/ACT inhaler Inhale 2 puffs into the lungs 2 (two) times daily.   12/26/2020 at Unknown time  . latanoprost (XALATAN) 0.005 % ophthalmic solution Place 1 drop into both eyes at bedtime.   12/26/2020 at Unknown time  . Magnesium 100 MG TABS Take 100 mg by mouth daily.   12/26/2020 at Unknown time  . Multiple Vitamin (MULTIVITAMIN) tablet Take 1 tablet by mouth daily.   12/26/2020 at Unknown time  . omeprazole (PRILOSEC) 20 MG capsule Take 20 mg by mouth 2 (two) times daily before a meal.   12/26/2020 at Unknown time  . naproxen (NAPROSYN) 500 MG tablet Take 500 mg by mouth 2 (two) times daily with a meal.   prn  . ondansetron (ZOFRAN ODT) 4 MG disintegrating  tablet Take 1 tablet (4 mg total) by mouth every 8 (eight) hours as needed for nausea or vomiting. 20 tablet 0 prn  . traMADol (ULTRAM) 50 MG tablet Take 50 mg by mouth every 6 (six) hours as needed for severe pain.   prn     Allergies  Allergen Reactions  . Oxycodone Other (See Comments)  . Penicillins Other (See Comments)    Did it involve swelling of the face/tongue/throat, SOB, or low BP? No Did it involve sudden or severe rash/hives, skin peeling, or any reaction on the inside of your mouth or nose? No Did you need to seek medical attention at a hospital or doctor's office? No When did it last happen?childhood If all above answers are "NO", may proceed with cephalosporin use.   . Tramadol Other (See Comments)     Past Medical History:  Diagnosis Date  . Arthritis   . Cataract cortical, senile   . History of colon polyps   . Osteopenia     Review of systems:  Otherwise negative.    Physical Exam  Gen: Alert, oriented. Appears stated age.  HEENT: PERRLA. Lungs: No respiratory distress CV: RRR Abd: soft, benign, no masses Ext: No edema    Planned procedures: Proceed with colonoscopy. The patient understands the nature of the planned procedure, indications, risks, alternatives and potential complications including but not limited to bleeding, infection, perforation, damage to internal organs and possible oversedation/side effects  from anesthesia. The patient agrees and gives consent to proceed.  Please refer to procedure notes for findings, recommendations and patient disposition/instructions.     Raylene Miyamoto MD, MPH Gastroenterology 12/27/2020  11:19 AM

## 2020-12-27 NOTE — Anesthesia Procedure Notes (Signed)
Performed by: Cook-Martin, Chelcea Zahn Pre-anesthesia Checklist: Patient identified, Emergency Drugs available, Suction available, Patient being monitored and Timeout performed Patient Re-evaluated:Patient Re-evaluated prior to induction Oxygen Delivery Method: Nasal cannula Preoxygenation: Pre-oxygenation with 100% oxygen Induction Type: IV induction Airway Equipment and Method: Bite block Placement Confirmation: positive ETCO2 and CO2 detector       

## 2020-12-27 NOTE — Interval H&P Note (Signed)
History and Physical Interval Note:  12/27/2020 11:22 AM  Courtney Flores  has presented today for surgery, with the diagnosis of GERD CHRONIC COUGH.  The various methods of treatment have been discussed with the patient and family. After consideration of risks, benefits and other options for treatment, the patient has consented to  Procedure(s): ESOPHAGOGASTRODUODENOSCOPY (EGD) WITH PROPOFOL (N/A) as a surgical intervention.  The patient's history has been reviewed, patient examined, no change in status, stable for surgery.  I have reviewed the patient's chart and labs.  Questions were answered to the patient's satisfaction.     Lesly Rubenstein  Ok to proceed with EGD

## 2020-12-27 NOTE — Op Note (Signed)
[facility] Patient Name: Courtney Flores Procedure Date: 12/27/2020 11:21 AM MRN: [id number] Account #: [id number] Date of Birth: 23-Jun-1941 Admit Type: Outpatient Age: 80 Room: [facility] ENDO ROOM 3 Gender: Female Note Status: Finalized Procedure:             Upper GI endoscopy Indications:           Gastro-esophageal reflux disease, Chronic cough Providers:             Andrey Farmer MD, MD Referring MD:          Sofie Hartigan (Referring MD) Medicines:             Monitored Anesthesia Care Complications:         No immediate complications. Estimated blood loss:                         Minimal. Procedure:             Pre-Anesthesia Assessment:                        - Prior to the procedure, a History and Physical was                         performed, and patient medications and allergies were                         reviewed. The patient is competent. The risks and                         benefits of the procedure and the sedation options and                         risks were discussed with the patient. All questions                         were answered and informed consent was obtained.                         Patient identification and proposed procedure were                         verified by the physician, the nurse, the anesthetist                         and the technician in the endoscopy suite. Mental                         Status Examination: alert and oriented. Airway                         Examination: normal oropharyngeal airway and neck                         mobility. Respiratory Examination: clear to                         auscultation. CV Examination: normal. Prophylactic  Antibiotics: The patient does not require prophylactic                         antibiotics. Prior Anticoagulants: The patient has                         taken no previous anticoagulant or antiplatelet                          agents. ASA Grade Assessment: II - A patient with mild                         systemic disease. After reviewing the risks and                         benefits, the patient was deemed in satisfactory                         condition to undergo the procedure. The anesthesia                         plan was to use monitored anesthesia care (MAC).                         Immediately prior to administration of medications,                         the patient was re-assessed for adequacy to receive                         sedatives. The heart rate, respiratory rate, oxygen                         saturations, blood pressure, adequacy of pulmonary                         ventilation, and response to care were monitored                         throughout the procedure. The physical status of the                         patient was re-assessed after the procedure.                        After obtaining informed consent, the endoscope was                         passed under direct vision. Throughout the procedure,                         the patient's blood pressure, pulse, and oxygen                         saturations were monitored continuously. The Endoscope                         was introduced through the mouth, and advanced to the  second part of duodenum. The upper GI endoscopy was                         accomplished without difficulty. The patient tolerated                         the procedure well. Findings:      White nummular lesions were noted in the entire esophagus. Brushings for       KOH prep were obtained in the entire esophagus. Estimated blood loss:       none.      A small hiatal hernia was present.      The exam of the esophagus was otherwise normal.      Biopsies were taken with a cold forceps in the entire esophagus for       histology. Estimated blood loss was minimal.      Multiple small sessile polyps with no stigmata of recent bleeding were        found in the gastric body. One polyp was sampled with a jumbo cold       forceps. Resection and retrieval were complete. Estimated blood loss was       minimal.      Normal mucosa was found in the stomach. Biopsies were taken with a cold       forceps for Helicobacter pylori testing. Estimated blood loss was       minimal.      The examined duodenum was normal. Impression:            - White nummular lesions in esophageal mucosa.                         Brushings performed.                        - Small hiatal hernia.                        - Multiple gastric polyps. Resected and retrieved.                        - Normal mucosa was found in the stomach. Biopsied.                        - Normal examined duodenum.                        - Biopsies were taken with a cold forceps for                         histology in the entire esophagus. Recommendation:        - Discharge patient to home.                        - Resume previous diet.                        - Continue present medications.                        - Await pathology results.                        -  Return to referring physician as previously                         scheduled. Procedure Code(s):     --- Professional ---                        [phone], Esophagogastroduodenoscopy, flexible,                         transoral; with biopsy, single or multiple Diagnosis Code(s):     --- Professional ---                        K22.8, Other specified diseases of esophagus                        K44.9, Diaphragmatic hernia without obstruction or                         gangrene                        K31.7, Polyp of stomach and duodenum                        K21.9, Gastro-esophageal reflux disease without                         esophagitis                        R05, Cough CPT copyright 2019 American Medical Association. All rights reserved. The codes documented in this report are preliminary and upon coder review may   be revised to meet current compliance requirements. Andrey Farmer MD, MD 12/27/2020 11:49:17 AM Number of Addenda: 0 Note Initiated On: 12/27/2020 11:21 AM Estimated Blood Loss:  Estimated blood loss was minimal.      [facility]

## 2020-12-27 NOTE — Anesthesia Preprocedure Evaluation (Signed)
Anesthesia Evaluation  Patient identified by MRN, date of birth, ID band Patient awake    Reviewed: Allergy & Precautions, NPO status , Patient's Chart, lab work & pertinent test results  History of Anesthesia Complications Negative for: history of anesthetic complications  Airway Mallampati: II  TM Distance: >3 FB Neck ROM: Full    Dental  (+) Implants, Dental Advidsory Given   Pulmonary neg pulmonary ROS, neg sleep apnea, neg COPD,    breath sounds clear to auscultation- rhonchi (-) wheezing      Cardiovascular Exercise Tolerance: Good (-) hypertension(-) angina(-) CAD, (-) Past MI, (-) Cardiac Stents and (-) CABG negative cardio ROS   Rhythm:Regular Rate:Normal - Systolic murmurs and - Diastolic murmurs    Neuro/Psych neg Seizures negative neurological ROS  negative psych ROS   GI/Hepatic Neg liver ROS,   Endo/Other  negative endocrine ROSneg diabetes  Renal/GU negative Renal ROS     Musculoskeletal  (+) Arthritis ,   Abdominal (+) - obese,   Peds  Hematology negative hematology ROS (+)   Anesthesia Other Findings Past Medical History: No date: Arthritis 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, senile No date: History of colon polyps No date: Osteopenia   Reproductive/Obstetrics                             Anesthesia Physical  Anesthesia Plan  ASA: II  Anesthesia Plan: General   Post-op Pain Management:    Induction: Intravenous  PONV Risk Score and Plan: 3 and Treatment may vary due to age or medical condition, TIVA and Propofol infusion  Airway Management Planned: Natural Airway and Nasal Cannula  Additional Equipment:   Intra-op Plan:   Post-operative Plan:   Informed Consent: I have reviewed the patients History and Physical, chart, labs and discussed the procedure including the risks, benefits and alternatives for the proposed anesthesia with the patient or authorized  representative who has indicated his/her understanding and acceptance.     Dental advisory given  Plan Discussed with: CRNA and Anesthesiologist  Anesthesia Plan Comments:         Anesthesia Quick Evaluation

## 2020-12-27 NOTE — Anesthesia Postprocedure Evaluation (Signed)
Anesthesia Post Note  Patient: YALONDA SAMPLE  Procedure(s) Performed: ESOPHAGOGASTRODUODENOSCOPY (EGD) WITH PROPOFOL (N/A )  Patient location during evaluation: Endoscopy Anesthesia Type: General Level of consciousness: awake and alert Pain management: pain level controlled Vital Signs Assessment: post-procedure vital signs reviewed and stable Respiratory status: spontaneous breathing, nonlabored ventilation, respiratory function stable and patient connected to nasal cannula oxygen Cardiovascular status: blood pressure returned to baseline and stable Postop Assessment: no apparent nausea or vomiting Anesthetic complications: no   No complications documented.   Last Vitals:  Vitals:   12/27/20 1155 12/27/20 1205  BP: 126/68 134/75  Pulse:    Resp:    Temp:    SpO2:      Last Pain:  Vitals:   12/27/20 1205  TempSrc:   PainSc: 0-No pain                 Martha Clan

## 2020-12-28 ENCOUNTER — Encounter: Payer: Self-pay | Admitting: Gastroenterology

## 2020-12-28 LAB — SURGICAL PATHOLOGY

## 2021-06-13 ENCOUNTER — Other Ambulatory Visit: Payer: Self-pay | Admitting: Family Medicine

## 2021-06-13 DIAGNOSIS — Z1231 Encounter for screening mammogram for malignant neoplasm of breast: Secondary | ICD-10-CM

## 2021-06-28 ENCOUNTER — Ambulatory Visit
Admission: RE | Admit: 2021-06-28 | Discharge: 2021-06-28 | Disposition: A | Discharge status: Home / Self Care | Payer: Medicare HMO | Source: Ambulatory Visit | Attending: Family Medicine | Admitting: Family Medicine

## 2021-06-28 ENCOUNTER — Other Ambulatory Visit: Payer: Self-pay

## 2021-06-28 DIAGNOSIS — Z1231 Encounter for screening mammogram for malignant neoplasm of breast: Secondary | ICD-10-CM | POA: Diagnosis present

## 2021-08-12 ENCOUNTER — Ambulatory Visit
Admission: EM | Admit: 2021-08-12 | Discharge: 2021-08-12 | Disposition: A | Discharge status: Home / Self Care | Payer: Medicare HMO | Attending: Emergency Medicine | Admitting: Emergency Medicine

## 2021-08-12 ENCOUNTER — Ambulatory Visit (INDEPENDENT_AMBULATORY_CARE_PROVIDER_SITE_OTHER): Payer: Medicare HMO

## 2021-08-12 ENCOUNTER — Other Ambulatory Visit: Payer: Self-pay

## 2021-08-12 DIAGNOSIS — S63502A Unspecified sprain of left wrist, initial encounter: Secondary | ICD-10-CM | POA: Diagnosis not present

## 2021-08-12 DIAGNOSIS — M25532 Pain in left wrist: Secondary | ICD-10-CM

## 2021-08-12 MED ORDER — MELOXICAM 7.5 MG PO TABS: 7.5000 mg | ORAL_TABLET | Freq: Every day | ORAL | 0 refills | Status: AC

## 2021-08-12 NOTE — ED Triage Notes (Signed)
Pt her with C/O left wrist pain for couple weeks ago was picking something up that was a little heavy and started feeling pain and still feeling the pain.

## 2021-08-12 NOTE — ED Provider Notes (Signed)
[facility]  ____________________________________________  Time seen: Approximately 11:31 AM  I have reviewed the triage vital signs and the nursing notes.   HISTORY  Chief Complaint Wrist Pain (left)    HPI Courtney Flores is a 80 y.o. female who presents the emergency department complaining of left wrist pain.  Patient was picking up a heavy load of laundry with another person when she felt a sharp pain in the medial aspect of her wrist.  This is primarily located over the distal ulna.  Symptoms have been ongoing x2 weeks with no real improvement.  She does have some swelling over the distal ulna.  No open wounds.  No history of previous injury to the wrist.  Patient has tried conservative therapy without improvement of symptoms.       Past Medical History:  Diagnosis Date   Arthritis    Cataract cortical, senile    History of colon polyps    Osteopenia     There are no problems to display for this patient.   Past Surgical History:  Procedure Laterality Date   APPENDECTOMY     CESAREAN SECTION     CHOLECYSTECTOMY     COLONOSCOPY     COLONOSCOPY WITH PROPOFOL N/A 06/23/2019   Procedure: COLONOSCOPY WITH PROPOFOL;  Surgeon: Lollie Sails, MD;  Location: [facility] ENDOSCOPY;  Service: Endoscopy;  Laterality: N/A;   ESOPHAGOGASTRODUODENOSCOPY (EGD) WITH PROPOFOL N/A 12/27/2020   Procedure: ESOPHAGOGASTRODUODENOSCOPY (EGD) WITH PROPOFOL;  Surgeon: Lesly Rubenstein, MD;  Location: [facility] ENDOSCOPY;  Service: Endoscopy;  Laterality: N/A;   EYE SURGERY     HIP PINNING,CANNULATED Right 12/22/2019   Procedure: CANNULATED HIP PINNING;  Surgeon: Leim Fabry, MD;  Location: [facility] ORS;  Service: Orthopedics;  Laterality: Right;   TONSILLECTOMY      Prior to Admission medications   Medication Sig Start Date End Date Taking? Authorizing Provider  calcium-vitamin D (OSCAL WITH D) 500-200 MG-UNIT tablet Take 1 tablet by mouth daily with breakfast.   Yes [provider]   clobetasol ointment (TEMOVATE) 5.46 % Apply 1 application topically 2 (two) times daily.   Yes [provider]  clonazePAM (KLONOPIN) 0.5 MG tablet Take 0.5 mg by mouth at bedtime as needed (sleep).   Yes [provider]  FLUoxetine (PROZAC) 20 MG tablet Take 20 mg by mouth daily.   Yes [provider]  fluticasone-salmeterol (ADVAIR HFA) 230-21 MCG/ACT inhaler Inhale 2 puffs into the lungs 2 (two) times daily.   Yes [provider]  latanoprost (XALATAN) 0.005 % ophthalmic solution Place 1 drop into both eyes at bedtime.   Yes [provider]  Magnesium 100 MG TABS Take 100 mg by mouth daily.   Yes [provider]  meloxicam (MOBIC) 7.5 MG tablet Take 1 tablet (7.5 mg total) by mouth daily. 08/12/21  Yes Trinika Cortese, Charline Bills, PA-C  Multiple Vitamin (MULTIVITAMIN) tablet Take 1 tablet by mouth daily.   Yes [provider]  naproxen (NAPROSYN) 500 MG tablet Take 500 mg by mouth 2 (two) times daily with a meal.   Yes [provider]  omeprazole (PRILOSEC) 20 MG capsule Take 20 mg by mouth 2 (two) times daily before a meal.   Yes [provider]  traMADol (ULTRAM) 50 MG tablet Take 50 mg by mouth every 6 (six) hours as needed for severe pain.   Yes [provider]  ondansetron (ZOFRAN ODT) 4 MG disintegrating tablet Take 1 tablet (4 mg total) by mouth every 8 (  eight) hours as needed for nausea or vomiting. 12/22/19   Leim Fabry, MD    Allergies Oxycodone, Penicillins, and Tramadol  Family History  Problem Relation Age of Onset   Breast cancer Neg Hx     Social History Social History   Tobacco Use   Smoking status: Never   Smokeless tobacco: Never  Vaping Use   Vaping Use: Never used  Substance Use Topics   Alcohol use: Yes    Alcohol/week: 2.0 standard drinks    Types: 1 Cans of beer, 1 Shots of liquor per week   Drug use: Never     Review of Systems  Constitutional: No fever/chills Eyes:  No visual changes. No discharge ENT: No upper respiratory complaints. Cardiovascular: no chest pain. Respiratory: no cough. No SOB. Gastrointestinal: No abdominal pain.  No nausea, no vomiting.  No diarrhea.  No constipation. Musculoskeletal: Positive for left wrist pain/injury Skin: Negative for rash, abrasions, lacerations, ecchymosis. Neurological: Negative for headaches, focal weakness or numbness.  10 System ROS otherwise negative.  ____________________________________________   PHYSICAL EXAM:  VITAL SIGNS: ED Triage Vitals  Enc Vitals Group     BP 08/12/21 1054 (!) 144/65     Pulse Rate 08/12/21 1054 63     Resp --      Temp 08/12/21 1054 98.7 F (37.1 C)     Temp Source 08/12/21 1054 Oral     SpO2 08/12/21 1054 100 %     Weight 08/12/21 1053 130 lb (59 kg)     Height 08/12/21 1053 5\' 2"  (1.575 m)     Head Circumference --      Peak Flow --      Pain Score 08/12/21 1053 4     Pain Loc --      Pain Edu? --      Excl. in [place]? --      Constitutional: Alert and oriented. Well appearing and in no acute distress. Eyes: Conjunctivae are normal. PERRL. EOMI. Head: Atraumatic. ENT:      Ears:       Nose: No congestion/rhinnorhea.      Mouth/Throat: Mucous membranes are moist.  Neck: No stridor.    Cardiovascular: Normal rate, regular rhythm. Normal S1 and S2.  Good peripheral circulation. Respiratory: Normal respiratory effort without tachypnea or retractions. Lungs CTAB. Good air entry to the bases with no decreased or absent breath sounds. Musculoskeletal: Full range of motion to all extremities. No gross deformities appreciated.  Visualization of the left wrist reveals some mild edema over the location of the ulnar styloid.  No open wounds.  No ecchymosis.  Range of motion is preserved to the wrist at this time.  Tender over the distal ulna but no other tenderness to palpation.  No palpable abnormality.  Full range of motion all digits, sensation and capillary refill  intact distally. Neurologic:  Normal speech and language. No gross focal neurologic deficits are appreciated.  Skin:  Skin is warm, dry and intact. No rash noted. Psychiatric: Mood and affect are normal. Speech and behavior are normal. Patient exhibits appropriate insight and judgement.   ____________________________________________   LABS (all labs ordered are listed, but only abnormal results are displayed)  Labs Reviewed - No data to display ____________________________________________  EKG   ____________________________________________  RADIOLOGY I personally viewed and evaluated these images as part of my medical decision making, as well as reviewing the written report by the radiologist.  ED Provider Interpretation: No acute traumatic finding on wrist xray  DG Wrist Complete Left  Result Date: 08/12/2021 CLINICAL DATA:  Wrist injury 2 weeks ago with ongoing pain. EXAM: LEFT WRIST - COMPLETE 3+ VIEW COMPARISON:  None. FINDINGS: The bones appear mildly demineralized. No evidence of acute fracture or dislocation. There is mild soft tissue swelling without evidence of foreign body or soft tissue emphysema. Minimal degenerative changes for age. IMPRESSION: No evidence of acute fracture or dislocation. Electronically Signed   By: Richardean Sale M.D.   On: 08/12/2021 12:13    ____________________________________________    PROCEDURES  Procedure(s) performed:    Procedures    Medications - No data to display   ____________________________________________   INITIAL IMPRESSION / ASSESSMENT AND PLAN / ED COURSE  Pertinent labs & imaging results that were available during my care of the patient were reviewed by me and considered in my medical decision making (see chart for details).  Review of the [place] CSRS was performed in accordance of the [place] prior to dispensing any controlled drugs.           Patient's diagnosis is consistent with sprain.  Patient presented to  emergency department pain over the ulnar styloid process.  Patient had injured her wrist 2 weeks ago and pain and continued.  There was some edema about this area as well.  Imaging revealed no acute traumatic finding.  Patient will have Velcro wrist brace for motion reduction, meloxicam for symptom improvement.  Follow-up primary care as needed..  Patient is given ED precautions to return to the ED for any worsening or new symptoms.     ____________________________________________  FINAL CLINICAL IMPRESSION(S) / DIAGNOSES  Final diagnoses:  Sprain of left wrist, initial encounter      NEW MEDICATIONS STARTED DURING THIS VISIT:  ED Discharge Orders          Ordered    meloxicam (MOBIC) 7.5 MG tablet  Daily        08/12/21 1229                This chart was dictated using voice recognition software/Dragon. Despite best efforts to proofread, errors can occur which can change the meaning. Any change was purely unintentional.    Darletta Moll, PA-C 08/12/21 1229

## 2022-03-12 IMAGING — CR DG WRIST COMPLETE 3+V*L*
5 series · 5 of 5 positions shown · non-contrast
Comparison: None.

CLINICAL DATA: Wrist injury 2 weeks ago with ongoing pain.

EXAM:
LEFT WRIST - COMPLETE 3+ VIEW

[wrist pa]
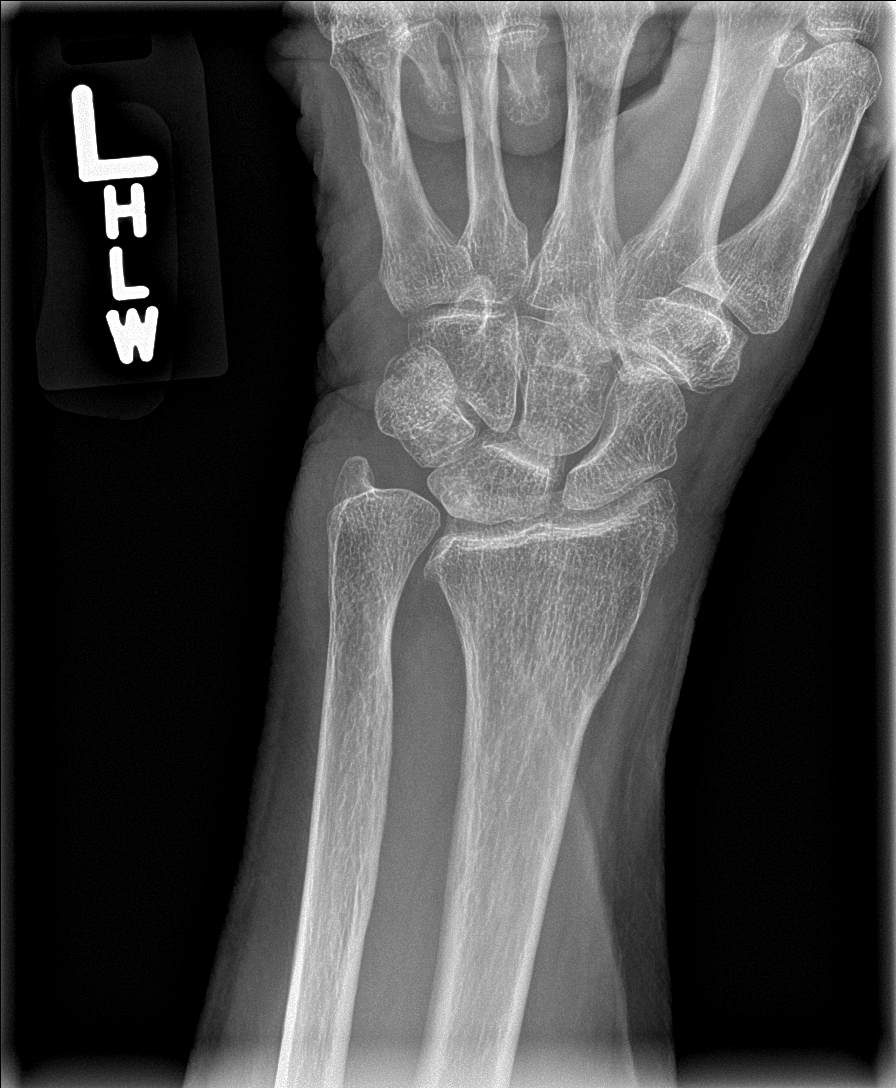

[wrist obl]
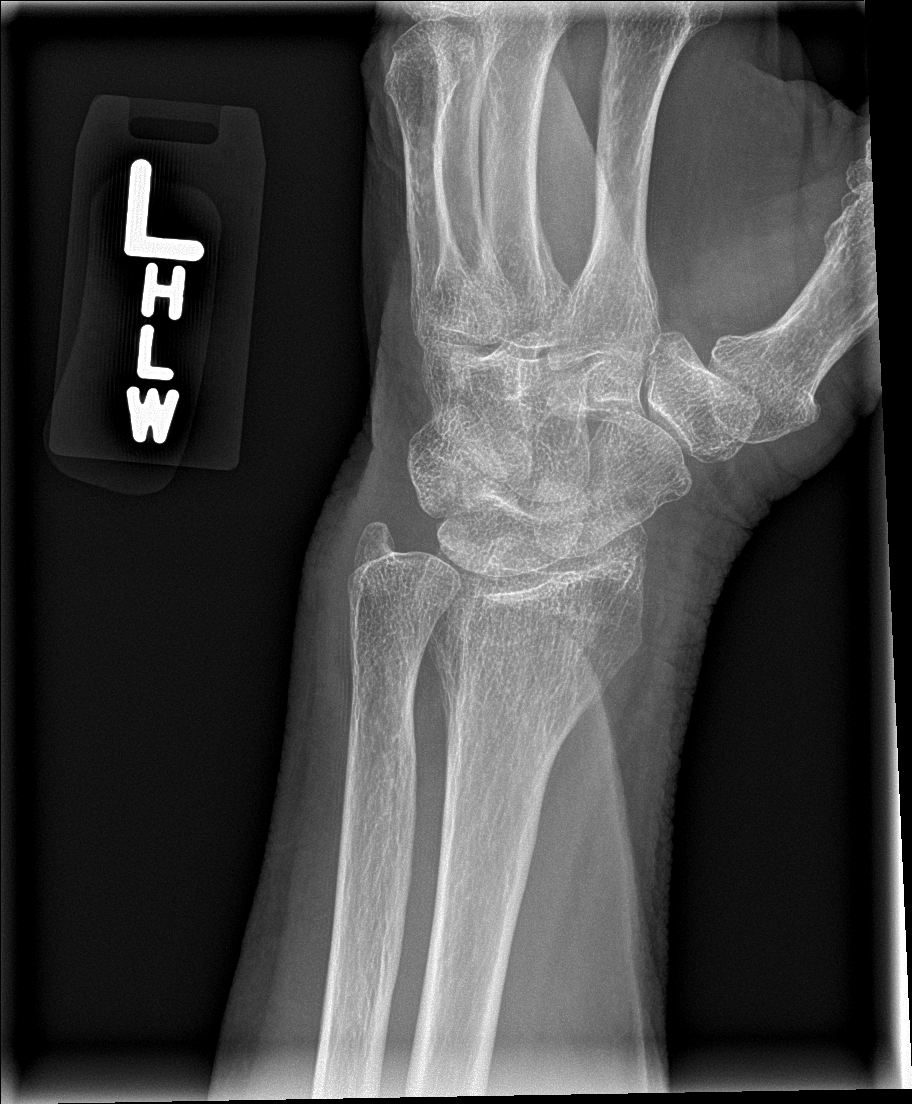

[wrist lat]
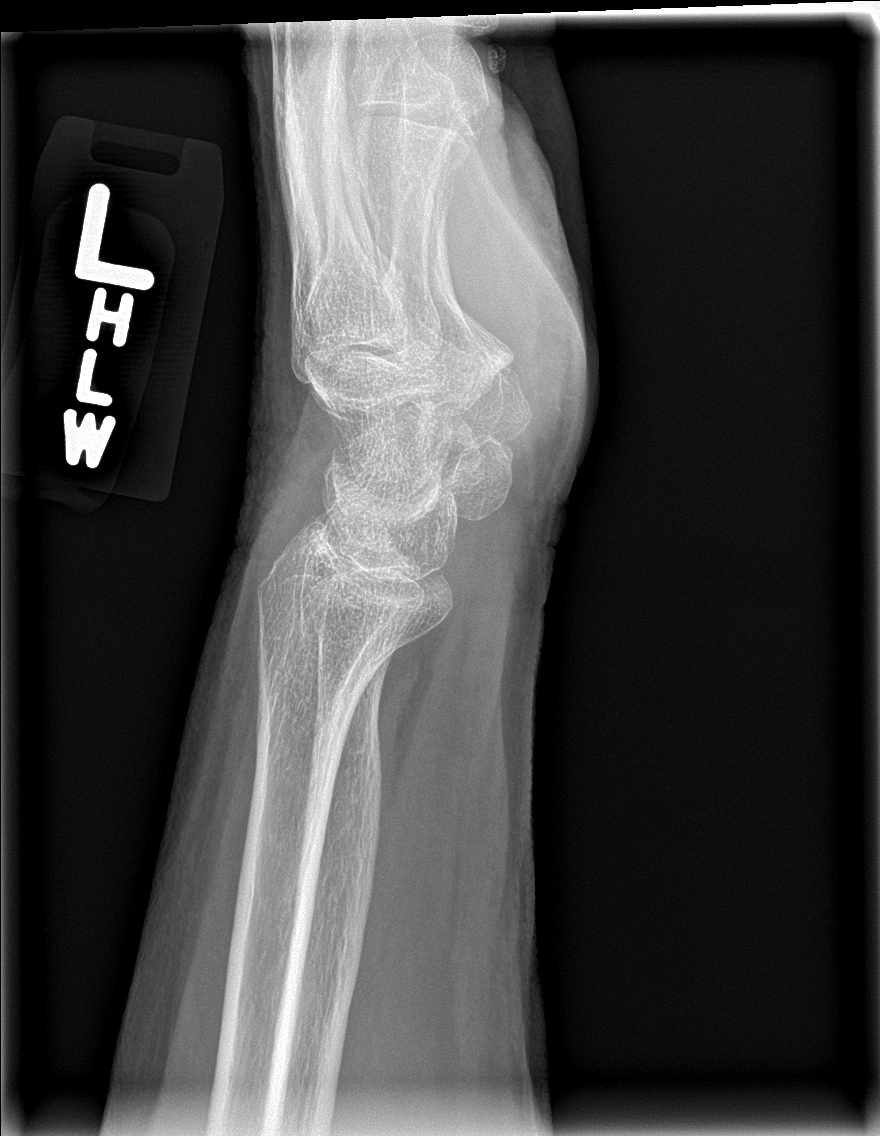

[wrist navicular (1 of 2)]
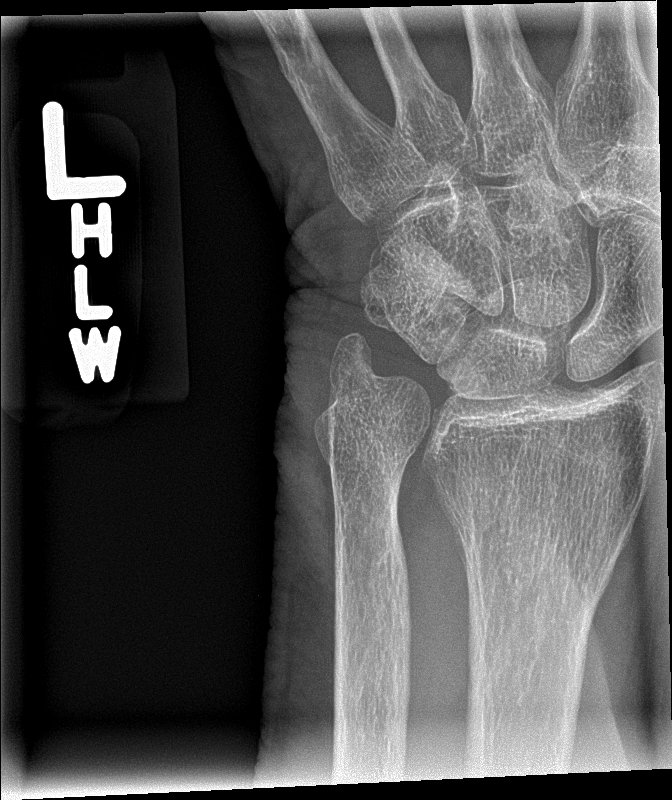

[wrist navicular (2 of 2)]
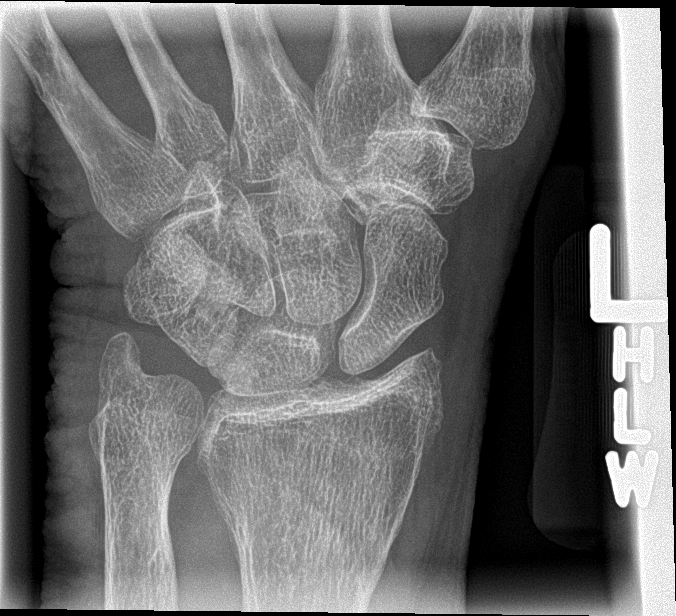

[5 of 5 positions shown; findings below may reference images not displayed]

FINDINGS: The bones appear mildly demineralized. No evidence of acute fracture
or dislocation. There is mild soft tissue swelling without evidence
of foreign body or soft tissue emphysema. Minimal degenerative
changes for age.
IMPRESSION: No evidence of acute fracture or dislocation.
